# Patient Record
Sex: Female | Born: 1960 | Race: White | Hispanic: No | Marital: Married | Smoking: Never smoker
Health system: Southern US, Community
[De-identification: ages and names within clinical notes are randomized; demographics above are authoritative.]

## PROBLEM LIST (undated history)

## (undated) HISTORY — PX: BREAST ENHANCEMENT SURGERY: SHX7

## (undated) HISTORY — PX: HERNIA REPAIR: SHX51

---

## 2017-06-03 ENCOUNTER — Emergency Department (HOSPITAL_BASED_OUTPATIENT_CLINIC_OR_DEPARTMENT_OTHER): Payer: BC Managed Care – PPO

## 2017-06-03 ENCOUNTER — Encounter (HOSPITAL_BASED_OUTPATIENT_CLINIC_OR_DEPARTMENT_OTHER): Payer: Self-pay | Admitting: Emergency Medicine

## 2017-06-03 ENCOUNTER — Other Ambulatory Visit: Payer: Self-pay

## 2017-06-03 ENCOUNTER — Emergency Department (HOSPITAL_BASED_OUTPATIENT_CLINIC_OR_DEPARTMENT_OTHER)
Admission: EM | Admit: 2017-06-03 | Discharge: 2017-06-03 | Disposition: A | Discharge status: Home / Self Care | Payer: BC Managed Care – PPO | Attending: Emergency Medicine | Admitting: Emergency Medicine

## 2017-06-03 DIAGNOSIS — R0602 Shortness of breath: Secondary | ICD-10-CM | POA: Diagnosis present

## 2017-06-03 DIAGNOSIS — J209 Acute bronchitis, unspecified: Secondary | ICD-10-CM | POA: Insufficient documentation

## 2017-06-03 LAB — CBC WITH DIFFERENTIAL/PLATELET
BASOS ABS: 0 10*3/uL (ref 0.0–0.1)
BASOS PCT: 1 %
EOS ABS: 0.1 10*3/uL (ref 0.0–0.7)
Eosinophils Relative: 2 %
HEMATOCRIT: 37.8 % (ref 36.0–46.0)
Hemoglobin: 12.5 g/dL (ref 12.0–15.0)
Lymphocytes Relative: 12 %
Lymphs Abs: 0.5 10*3/uL — ABNORMAL LOW (ref 0.7–4.0)
MCH: 30.6 pg (ref 26.0–34.0)
MCHC: 33.1 g/dL (ref 30.0–36.0)
MCV: 92.4 fL (ref 78.0–100.0)
MONO ABS: 0.6 10*3/uL (ref 0.1–1.0)
MONOS PCT: 16 %
NEUTROS ABS: 2.7 10*3/uL (ref 1.7–7.7)
NEUTROS PCT: 69 %
Platelets: 299 10*3/uL (ref 150–400)
RBC: 4.09 MIL/uL (ref 3.87–5.11)
RDW: 12.2 % (ref 11.5–15.5)
WBC: 3.8 10*3/uL — ABNORMAL LOW (ref 4.0–10.5)

## 2017-06-03 LAB — BASIC METABOLIC PANEL
ANION GAP: 10 (ref 5–15)
BUN: 10 mg/dL (ref 6–20)
CALCIUM: 8.6 mg/dL — AB (ref 8.9–10.3)
CO2: 26 mmol/L (ref 22–32)
CREATININE: 0.79 mg/dL (ref 0.44–1.00)
Chloride: 104 mmol/L (ref 101–111)
GFR calc non Af Amer: >60 mL/min (ref >60)
Glucose, Bld: 102 mg/dL — ABNORMAL HIGH (ref 65–99)
Potassium: 3.7 mmol/L (ref 3.5–5.1)
SODIUM: 140 mmol/L (ref 135–145)

## 2017-06-03 LAB — TROPONIN I: Troponin I: <0.03 ng/mL (ref <0.03)

## 2017-06-03 LAB — D-DIMER, QUANTITATIVE (NOT AT ARMC): D DIMER QUANT: 0.72 ug{FEU}/mL — AB (ref 0.00–0.50)

## 2017-06-03 MED ORDER — IPRATROPIUM-ALBUTEROL 0.5-2.5 (3) MG/3ML IN SOLN
3.0000 mL | Freq: Once | RESPIRATORY_TRACT | Status: AC
  Administered 2017-06-03: 3 mL via RESPIRATORY_TRACT
  Filled 2017-06-03: qty 3

## 2017-06-03 MED ORDER — HYDROCOD POLST-CPM POLST ER 10-8 MG/5ML PO SUER: 5.0000 mL | Freq: Two times a day (BID) | ORAL | 0 refills | Status: DC | PRN

## 2017-06-03 MED ORDER — ALBUTEROL SULFATE HFA 108 (90 BASE) MCG/ACT IN AERS
2.0000 | INHALATION_SPRAY | RESPIRATORY_TRACT | Status: DC | PRN
  Administered 2017-06-03: 2 via RESPIRATORY_TRACT
  Filled 2017-06-03: qty 6.7

## 2017-06-03 MED ORDER — PREDNISONE 20 MG PO TABS: ORAL_TABLET | ORAL | 0 refills | Status: DC

## 2017-06-03 MED ORDER — IOPAMIDOL (ISOVUE-370) INJECTION 76%
100.0000 mL | Freq: Once | INTRAVENOUS | Status: AC | PRN
  Administered 2017-06-03: 100 mL via INTRAVENOUS

## 2017-06-03 MED ORDER — METHYLPREDNISOLONE SODIUM SUCC 125 MG IJ SOLR
125.0000 mg | Freq: Once | INTRAMUSCULAR | Status: AC
  Administered 2017-06-03: 125 mg via INTRAVENOUS
  Filled 2017-06-03: qty 2

## 2017-06-03 NOTE — ED Notes (Signed)
ED Provider at bedside. 

## 2017-06-03 NOTE — ED Notes (Signed)
Patient transported to X-ray 

## 2017-06-03 NOTE — ED Triage Notes (Addendum)
Pt with cough, cold and congestion. Pt feels shob. Pt's husband here x 2 days ago with same symptoms.

## 2017-06-03 NOTE — ED Provider Notes (Signed)
[facility] EMERGENCY DEPARTMENT Provider Note   CSN: [id number] Arrival date & time: 06/03/17  0354     History   Chief Complaint Chief Complaint  Patient presents with  . URI    HPI Jhane Bal is a 57 y.o. female.  Patient presents to the ER for evaluation of shortness of breath.  Patient reports that she has had a cold for several days.  She has had cough and congestion.  She denies nausea, vomiting, diarrhea.  She has not had myalgias or body aches.  Tonight her breathing worsens.  She felt like she could not catch her breath and then she started to notice pain with breathing.  Symptoms worsened when she lie down.  Patient reports that in the past she had to see a pulmonologist forward breathing difficulty, was treated with Flovent and albuterol.      History reviewed. No pertinent past medical history.  There are no active problems to display for this patient.   Past Surgical History:  Procedure Laterality Date  . BREAST ENHANCEMENT SURGERY    . CESAREAN SECTION    . HERNIA REPAIR      OB History    No data available       Home Medications    Prior to Admission medications   Medication Sig Start Date End Date Taking? Authorizing Provider  chlorpheniramine-HYDROcodone (TUSSIONEX PENNKINETIC ER) 10-8 MG/5ML SUER Take 5 mLs by mouth every 12 (twelve) hours as needed for cough. 06/03/17   Gilda Crease, MD  predniSONE (DELTASONE) 20 MG tablet 3 tabs po daily x 3 days, then 2 tabs x 3 days, then 1.5 tabs x 3 days, then 1 tab x 3 days, then 0.5 tabs x 3 days 06/03/17   Gilda Crease, MD    Family History No family history on file.  Social History Social History   Tobacco Use  . Smoking status: Never Smoker  . Smokeless tobacco: Never Used  Substance Use Topics  . Alcohol use: No    Frequency: Never  . Drug use: No     Allergies   Patient has no known allergies.   Review of Systems Review of Systems  Respiratory:  Positive for cough and shortness of breath.   All other systems reviewed and are negative.    Physical Exam Updated Vital Signs BP (!) 147/98 (BP Location: Left Arm)   Pulse (!) 112   Temp 100.1 F (37.8 C) (Oral)   Resp 20   Ht 5\' 5"  (1.651 m)   Wt 63.5 kg (140 lb)   SpO2 100%   BMI 23.30 kg/m   Physical Exam  Constitutional: She is oriented to person, place, and time. She appears well-developed and well-nourished. No distress.  HENT:  Head: Normocephalic and atraumatic.  Right Ear: Hearing normal.  Left Ear: Hearing normal.  Nose: Nose normal.  Mouth/Throat: Oropharynx is clear and moist and mucous membranes are normal.  Eyes: Conjunctivae and EOM are normal. Pupils are equal, round, and reactive to light.  Neck: Normal range of motion. Neck supple.  Cardiovascular: Regular rhythm, S1 normal and S2 normal. Exam reveals no gallop and no friction rub.  No murmur heard. Pulmonary/Chest: Effort normal and breath sounds normal. No respiratory distress. She exhibits no tenderness.  Abdominal: Soft. Normal appearance and bowel sounds are normal. There is no hepatosplenomegaly. There is no tenderness. There is no rebound, no guarding, no tenderness at McBurney's point and negative Murphy's sign. No hernia.  Musculoskeletal: Normal  range of motion.  Neurological: She is alert and oriented to person, place, and time. She has normal strength. No cranial nerve deficit or sensory deficit. Coordination normal. GCS eye subscore is 4. GCS verbal subscore is 5. GCS motor subscore is 6.  Skin: Skin is warm, dry and intact. No rash noted. No cyanosis.  Psychiatric: Her speech is normal and behavior is normal. Thought content normal. Her mood appears anxious.  Nursing note and vitals reviewed.    ED Treatments / Results  Labs (all labs ordered are listed, but only abnormal results are displayed) Labs Reviewed  CBC WITH DIFFERENTIAL/PLATELET - Abnormal; Notable for the following components:        Result Value   WBC 3.8 (*)    Lymphs Abs 0.5 (*)    All other components within normal limits  BASIC METABOLIC PANEL - Abnormal; Notable for the following components:   Glucose, Bld 102 (*)    Calcium 8.6 (*)    All other components within normal limits  D-DIMER, QUANTITATIVE (NOT AT [facility]) - Abnormal; Notable for the following components:   D-Dimer, Quant 0.72 (*)    All other components within normal limits  TROPONIN I    EKG  EKG Interpretation None     ED ECG REPORT   Date: 06/03/2017  Rate: 117  Rhythm: sinus tachycardia  QRS Axis: normal  Intervals: normal  ST/T Wave abnormalities: normal  Conduction Disutrbances:none  Narrative Interpretation:   Old EKG Reviewed: none available  I have personally reviewed the EKG tracing and agree with the computerized printout as noted.   Radiology Dg Chest 2 View  Result Date: 06/03/2017 CLINICAL DATA:  Cough, cold, congestion and shortness of breath for 2 weeks. EXAM: CHEST  2 VIEW COMPARISON:  None. FINDINGS: Cardiomediastinal silhouette is normal. No pleural effusions or focal consolidations. Trachea projects midline and there is no pneumothorax. Soft tissue planes and included osseous structures are non-suspicious. Bilateral breast implants. IMPRESSION: Negative. Electronically Signed   By: Awilda Metro M.D.   On: 06/03/2017 04:47   Ct Angio Chest Pe W Or Wo Contrast  Result Date: 06/03/2017 CLINICAL DATA:  Shortness of breath. Cough, cold, congestion. Elevated D-dimer. EXAM: CT ANGIOGRAPHY CHEST WITH CONTRAST TECHNIQUE: Multidetector CT imaging of the chest was performed using the standard protocol during bolus administration of intravenous contrast. Multiplanar CT image reconstructions and MIPs were obtained to evaluate the vascular anatomy. CONTRAST:  ISOVUE-370 IOPAMIDOL (ISOVUE-370) INJECTION 76% COMPARISON:  Chest radiographs earlier this day. FINDINGS: Cardiovascular: There are no filling defects within the  pulmonary arteries to suggest pulmonary embolus. Thoracic aorta is normal caliber without dissection or aneurysm. The heart is normal in size. No pericardial effusion. Mediastinum/Nodes: No enlarged mediastinal or hilar nodes. The esophagus is decompressed. No thyroid nodule. Lungs/Pleura: Clear lungs. No consolidation, pulmonary edema or pleural fluid. No pulmonary mass or suspicious nodule. Upper Abdomen: Normal.  No acute finding. Musculoskeletal: There are no acute or suspicious osseous abnormalities. Bilateral breast implants. Review of the MIP images confirms the above findings. IMPRESSION: Normal CTA of the chest.  No pulmonary embolus. Electronically Signed   By: Rubye Oaks M.D.   On: 06/03/2017 05:40    Procedures Procedures (including critical care time)  Medications Ordered in ED Medications  albuterol (PROVENTIL HFA;VENTOLIN HFA) 108 (90 Base) MCG/ACT inhaler 2 puff (not administered)  methylPREDNISolone sodium succinate (SOLU-MEDROL) 125 mg/2 mL injection 125 mg (125 mg Intravenous Given 06/03/17 0430)  ipratropium-albuterol (DUONEB) 0.5-2.5 (3) MG/3ML nebulizer solution 3  mL (3 mLs Nebulization Given 06/03/17 0421)  iopamidol (ISOVUE-370) 76 % injection 100 mL (100 mLs Intravenous Contrast Given 06/03/17 0513)     Initial Impression / Assessment and Plan / ED Course  I have reviewed the triage vital signs and the nursing notes.  Pertinent labs & imaging results that were available during my care of the patient were reviewed by me and considered in my medical decision making (see chart for details).     Patient presents with complaints of chest pain, shortness of breath.  This occurs in the setting of cold symptoms including cough.  Patient is extremely anxious at arrival.  I suspect that majority of her symptoms are secondary to anxiety and panic attack.  She does have some cold symptoms and did have a borderline low-grade fever at arrival.  Patient started to complain of chest  pain and then pleuritic pain here in the ER.  She therefore underwent thorough workup to rule out other etiologies.  Patient's d-dimer was mildly elevated.  She was tachycardic and having some pleuritic pain, could not rule out PE.  Patient therefore underwent CT angiography.  This was clear, no PE no pneumonia.  Final Clinical Impressions(s) / ED Diagnoses   Final diagnoses:  Acute bronchitis, unspecified organism    ED Discharge Orders        Ordered    predniSONE (DELTASONE) 20 MG tablet     06/03/17 0606    chlorpheniramine-HYDROcodone (TUSSIONEX PENNKINETIC ER) 10-8 MG/5ML SUER  Every 12 hours PRN     06/03/17 0606       Gilda CreasePollina, Christopher J, MD 06/03/17 [phone]

## 2019-01-17 IMAGING — CT CT ANGIO CHEST
2 of 7 series · 19 of 36 positions shown · IV contrast (iopamidol)
Comparison: Chest radiographs earlier this day.

CLINICAL DATA: Shortness of breath. Cough, cold, congestion.
Elevated D-dimer.

EXAM:
CT ANGIOGRAPHY CHEST WITH CONTRAST
TECHNIQUE: Multidetector CT imaging of the chest was performed using the
standard protocol during bolus administration of intravenous
contrast. Multiplanar CT image reconstructions and MIPs were
obtained to evaluate the vascular anatomy.
CONTRAST:  100mL 5PXV6Y-QEC IOPAMIDOL (5PXV6Y-QEC) INJECTION 76%

[Series 10: pe coronal mpr · coronal · 0.59mm/px · 1 of 114 slices shown]
[im 57/114  mediastinal]
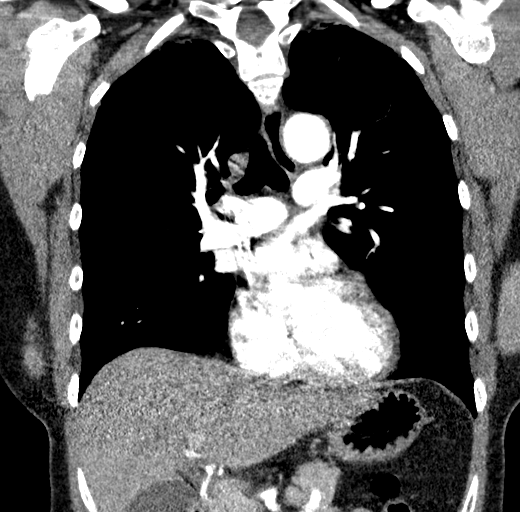

[Series 14: pe thins · axial · 0.64mm/px · z∈[+1020,+1296]mm · 18 of 309 slices shown]
[im 16/309  lung]
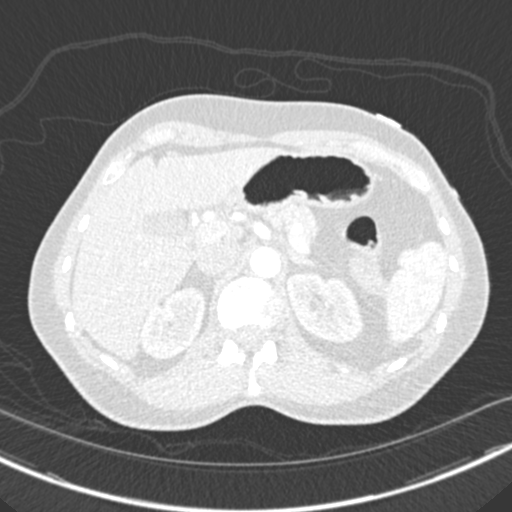
[im 31/309  mediastinal]
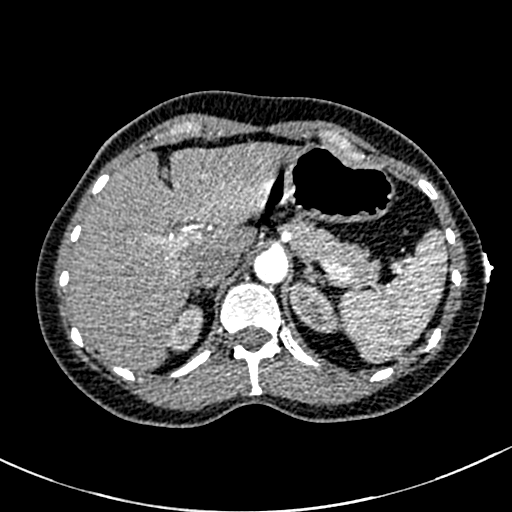
[im 47/309  lung]
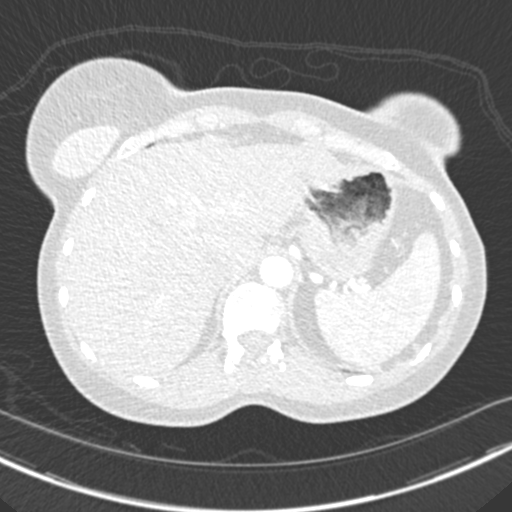
[im 62/309  mediastinal]
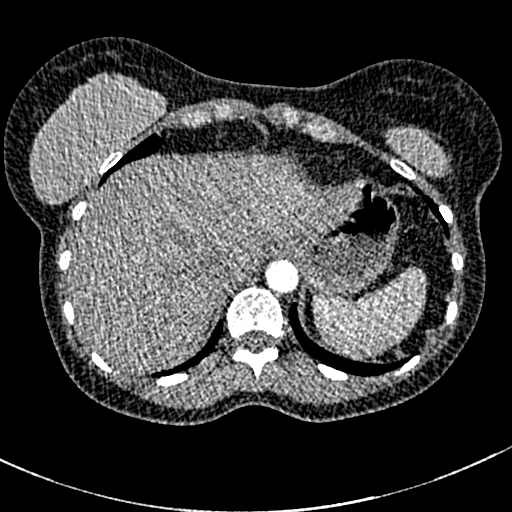
[im 78/309  lung]
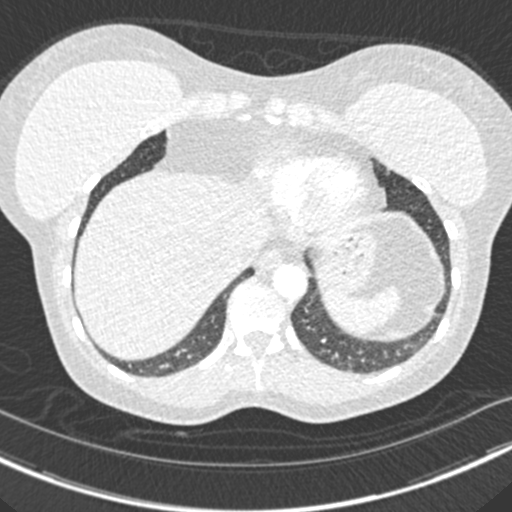
[im 93/309  mediastinal]
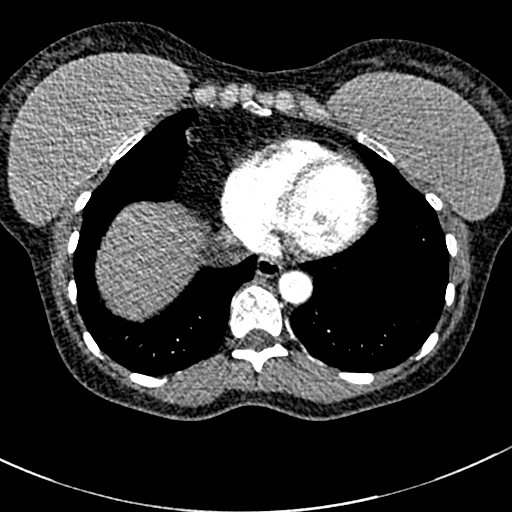
[im 108/309  lung]
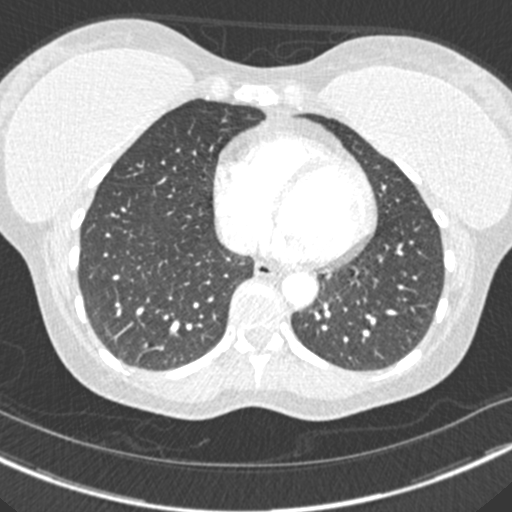
[im 124/309  mediastinal]
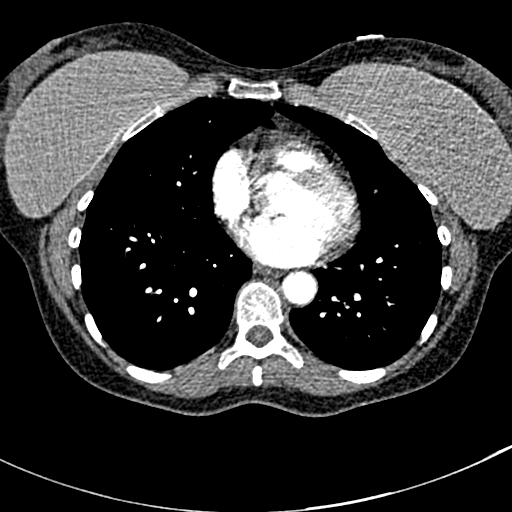
[im 139/309  lung]
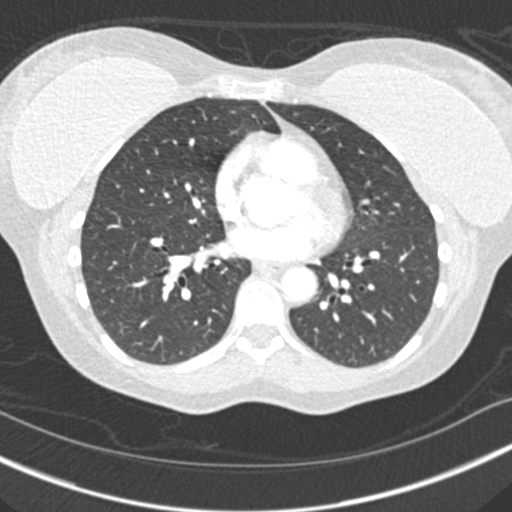
[im 170/309  mediastinal]
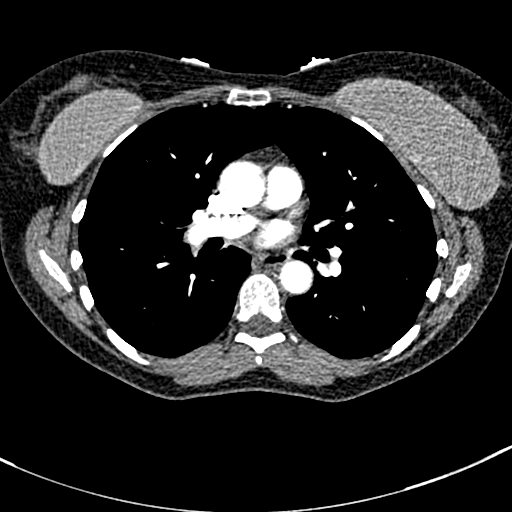
[im 185/309  lung]
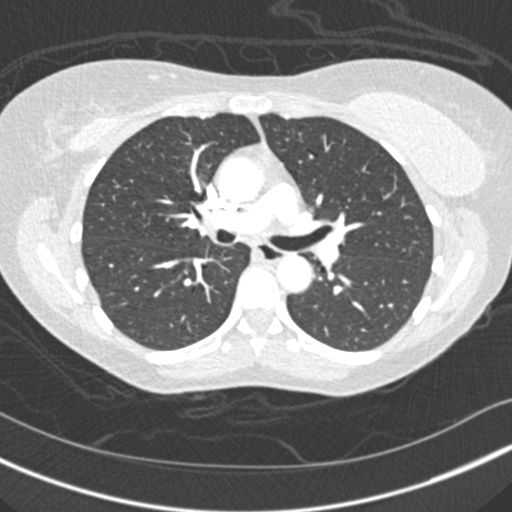
[im 201/309  mediastinal]
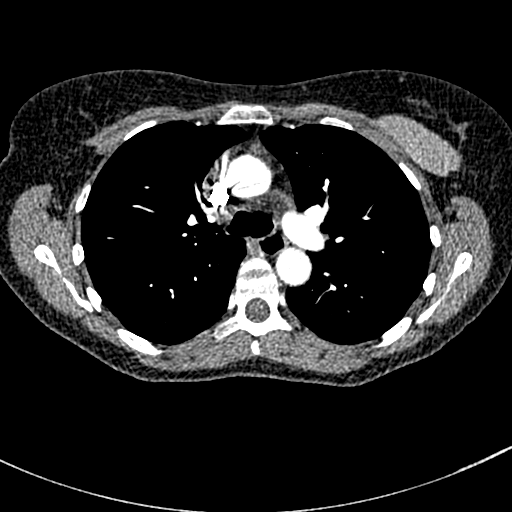
[im 216/309  lung]
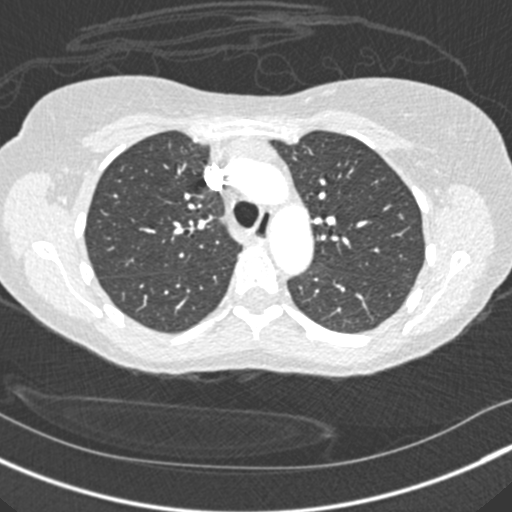
[im 232/309  mediastinal]
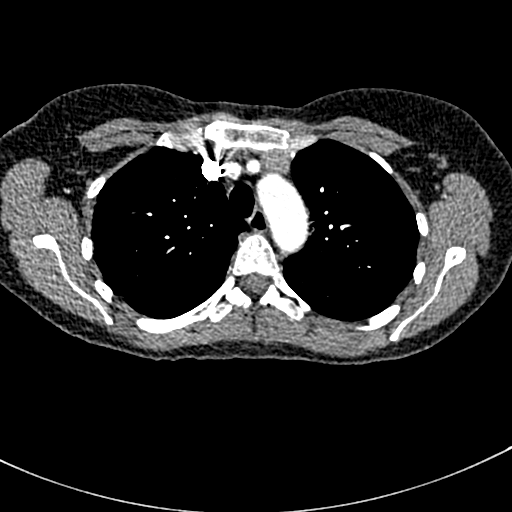
[im 247/309  lung]
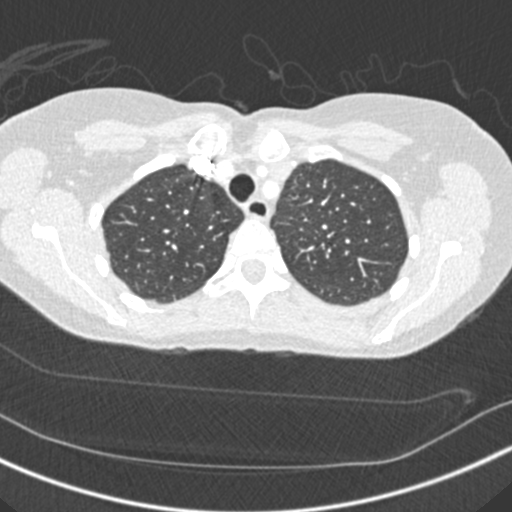
[im 262/309  mediastinal]
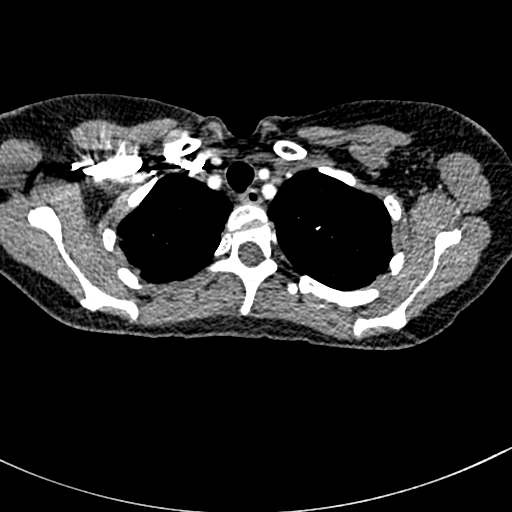
[im 278/309  lung]
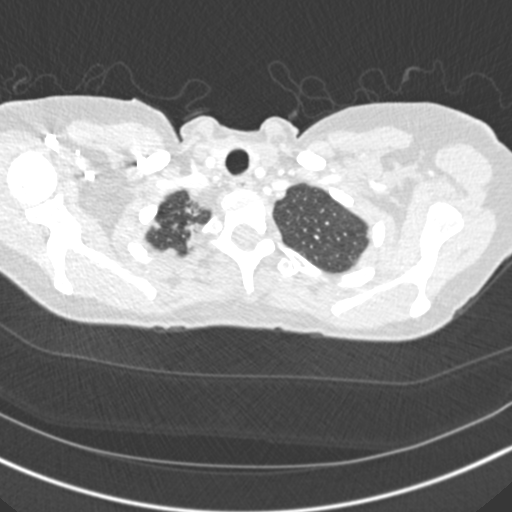
[im 293/309  mediastinal]
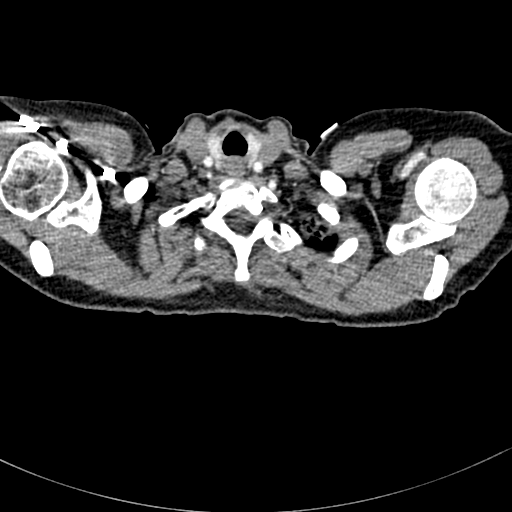

[19 of 36 positions shown; findings below may reference images not displayed]

FINDINGS: Cardiovascular: There are no filling defects within the pulmonary
arteries to suggest pulmonary embolus. Thoracic aorta is normal
caliber without dissection or aneurysm. The heart is normal in size.
No pericardial effusion.

Mediastinum/Nodes: No enlarged mediastinal or hilar nodes. The
esophagus is decompressed. No thyroid nodule.

Lungs/Pleura: Clear lungs. No consolidation, pulmonary edema or
pleural fluid. No pulmonary mass or suspicious nodule.

Upper Abdomen: Normal.  No acute finding.

Musculoskeletal: There are no acute or suspicious osseous
abnormalities. Bilateral breast implants.

Review of the MIP images confirms the above findings.
IMPRESSION: Normal CTA of the chest.  No pulmonary embolus.

## 2022-10-10 ENCOUNTER — Ambulatory Visit
Admission: EM | Admit: 2022-10-10 | Discharge: 2022-10-10 | Disposition: A | Discharge status: Home / Self Care | Payer: BC Managed Care – PPO

## 2022-10-10 ENCOUNTER — Ambulatory Visit (INDEPENDENT_AMBULATORY_CARE_PROVIDER_SITE_OTHER): Payer: BC Managed Care – PPO

## 2022-10-10 DIAGNOSIS — M79672 Pain in left foot: Secondary | ICD-10-CM | POA: Diagnosis not present

## 2022-10-10 NOTE — Discharge Instructions (Addendum)
Advised patient of left foot x-ray results with hardcopy provided.  Advised if symptoms worsen and/or unresolved please follow-up with your PCP or [facility] podiatry on Monday, 10/13/2022 for further evaluation of neuroma type pain.  Contact information has been provided with this AVS today.

## 2022-10-10 NOTE — ED Provider Notes (Signed)
[facility]    CSN: [id number] Arrival date & time: 10/10/22  1506      History   Chief Complaint Chief Complaint  Patient presents with   Foot Pain    LT    HPI Melanie Harrison is a 62 y.o. female.   HPI 62 year old female presents with left foot pain for 2.5 weeks denies injury or insult.  Reports symptoms have worsened over the past 2 days.  Patient reports feels like cyst on the sole of her left foot.  PMH significant for breast enhancement surgery and recurrent urinary tract infections.  History reviewed. No pertinent past medical history.  There are no problems to display for this patient.   Past Surgical History:  Procedure Laterality Date   BREAST ENHANCEMENT SURGERY     CESAREAN SECTION     HERNIA REPAIR      OB History   No obstetric history on file.      Home Medications    Prior to Admission medications   Medication Sig Start Date End Date Taking? Authorizing Provider  meclizine (ANTIVERT) 25 MG tablet     [provider]    Family History History reviewed. No pertinent family history.  Social History Social History   Tobacco Use   Smoking status: Never   Smokeless tobacco: Never  Substance Use Topics   Alcohol use: No   Drug use: No     Allergies   Patient has no known allergies.   Review of Systems Review of Systems  Musculoskeletal:        Left foot pain  All other systems reviewed and are negative.    Physical Exam Triage Vital Signs ED Triage Vitals  Enc Vitals Group     BP      Pulse      Resp      Temp      Temp src      SpO2      Weight      Height      Head Circumference      Peak Flow      Pain Score      Pain Loc      Pain Edu?      Excl. in GC?    No data found.  Updated Vital Signs BP 125/78 (BP Location: Right Arm)   Pulse 78   Temp 98.2 F (36.8 C) (Oral)   Resp 17   SpO2 98%    Physical Exam Vitals and nursing note reviewed.  Constitutional:      Appearance:  Normal appearance. She is normal weight.  HENT:     Head: Normocephalic and atraumatic.     Mouth/Throat:     Mouth: Mucous membranes are moist.     Pharynx: Oropharynx is clear.  Eyes:     Extraocular Movements: Extraocular movements intact.     Conjunctiva/sclera: Conjunctivae normal.     Pupils: Pupils are equal, round, and reactive to light.  Cardiovascular:     Rate and Rhythm: Normal rate and regular rhythm.     Pulses: Normal pulses.     Heart sounds: Normal heart sounds.  Pulmonary:     Effort: Pulmonary effort is normal.     Breath sounds: Normal breath sounds. No wheezing, rhonchi or rales.  Genitourinary:    General: Normal vulva.  Musculoskeletal:        General: Normal range of motion.     Cervical back: Normal range of motion and  neck supple.     Comments: Left foot (sole adjacent to calcaneus, lateral aspect) small (<1.0 cm) painful nodule noted  Skin:    General: Skin is warm and dry.  Neurological:     General: No focal deficit present.     Mental Status: She is alert and oriented to person, place, and time. Mental status is at baseline.  Psychiatric:        Mood and Affect: Mood normal.        Behavior: Behavior normal.      UC Treatments / Results  Labs (all labs ordered are listed, but only abnormal results are displayed) Labs Reviewed - No data to display  EKG   Radiology DG Foot Complete Left  Result Date: 10/10/2022 CLINICAL DATA:  Pain on sole of left foot EXAM: LEFT FOOT - COMPLETE 3+ VIEW COMPARISON:  None Available. FINDINGS: No acute fracture or dislocation.The joint spaces are preserved.Alignment is unremarkable.No radiopaque soft tissue abnormality or foreign body. IMPRESSION: No acute osseous abnormality. Electronically Signed   By: Wiliam Ke M.D.   On: 10/10/2022 16:13    Procedures Procedures (including critical care time)  Medications Ordered in UC Medications - No data to display  Initial Impression / Assessment and Plan /  UC Course  I have reviewed the triage vital signs and the nursing notes.  Pertinent labs & imaging results that were available during my care of the patient were reviewed by me and considered in my medical decision making (see chart for details).     MDM; 1.  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  Advised patient to follow-up with [facility] podiatry as soon as possible for further evaluation contact information provided with this AVS today. Advised patient of left foot x-ray results with hardcopy provided.  Advised if symptoms worsen and/or unresolved please follow-up with your PCP or [facility] podiatry on Monday, 10/13/2022 for further evaluation of neuroma type pain.  Contact information has been provided with this AVS today.  Patient discharged home, hemodynamically stable. Final Clinical Impressions(s) / UC Diagnoses   Final diagnoses:  Foot pain, left     Discharge Instructions      Advised patient of left foot x-ray results with hardcopy provided.  Advised if symptoms worsen and/or unresolved please follow-up with your PCP or [facility] podiatry on Monday, 10/13/2022 for further evaluation of neuroma type pain.  Contact information has been provided with this AVS today.     ED Prescriptions   None    PDMP not reviewed this encounter.   Trevor Iha, FNP 10/10/22 1640

## 2022-10-10 NOTE — ED Triage Notes (Signed)
Pt c/o LT foot pain x 2.5 weeks. Denies injury. Sxs worsening in last couple days. States it almost feels like a cyst in the bottom of her foot. Warm foot soaks and tylenol prn.

## 2022-10-30 ENCOUNTER — Ambulatory Visit (INDEPENDENT_AMBULATORY_CARE_PROVIDER_SITE_OTHER): Payer: Self-pay | Admitting: Podiatry

## 2022-10-30 DIAGNOSIS — Z91199 Patient's noncompliance with other medical treatment and regimen due to unspecified reason: Secondary | ICD-10-CM

## 2022-10-30 NOTE — Progress Notes (Signed)
No show

## 2023-06-16 ENCOUNTER — Ambulatory Visit: Admission: EM | Admit: 2023-06-16 | Discharge: 2023-06-16 | Disposition: A | Discharge status: Home / Self Care

## 2023-06-16 DIAGNOSIS — J029 Acute pharyngitis, unspecified: Secondary | ICD-10-CM

## 2023-06-16 LAB — POCT INFLUENZA A/B
Influenza A, POC: NEGATIVE
Influenza B, POC: NEGATIVE

## 2023-06-16 LAB — POC SARS CORONAVIRUS 2 AG -  ED: SARS Coronavirus 2 Ag: NEGATIVE

## 2023-06-16 MED ORDER — AZITHROMYCIN 250 MG PO TABS: 250.0000 mg | ORAL_TABLET | Freq: Every day | ORAL | 0 refills | Status: AC

## 2023-06-16 NOTE — Discharge Instructions (Addendum)
 Advised patient to take medication as directed with food to completion.  Encouraged to increase daily water intake to 64 ounces per day while taking this medication.  Advised if symptoms worsen and/or unresolved please follow-up with your PCP or here for further evaluation.

## 2023-06-16 NOTE — ED Provider Notes (Signed)
 [facility]    CSN: [id number] Arrival date & time: 06/16/23  1258      History   Chief Complaint Chief Complaint  Patient presents with   Sore Throat   Cough    HPI Melanie Harrison is a 63 y.o. female.   HPI 63 year old female presents with cough and sore throat that began yesterday.  PMH significant for s/p hernia repair and HLD.  History reviewed. No pertinent past medical history.  There are no active problems to display for this patient.   Past Surgical History:  Procedure Laterality Date   BREAST ENHANCEMENT SURGERY     CESAREAN SECTION     HERNIA REPAIR      OB History   No obstetric history on file.      Home Medications    Prior to Admission medications   Medication Sig Start Date End Date Taking? Authorizing Provider  azithromycin (ZITHROMAX) 250 MG tablet Take 1 tablet (250 mg total) by mouth daily. Take first 2 tablets together, then 1 every day until finished. 06/16/23  Yes Trevor Iha, FNP  rosuvastatin (CRESTOR) 10 MG tablet Take 10 mg by mouth daily. 03/04/23  Yes [provider]  meclizine (ANTIVERT) 25 MG tablet     [provider]    Family History History reviewed. No pertinent family history.  Social History Social History   Tobacco Use   Smoking status: Never   Smokeless tobacco: Never  Substance Use Topics   Alcohol use: No   Drug use: No     Allergies   Patient has no known allergies.   Review of Systems Review of Systems   Physical Exam Triage Vital Signs ED Triage Vitals  Encounter Vitals Group     BP 06/16/23 1310 (!) 145/86     Systolic BP Percentile --      Diastolic BP Percentile --      Pulse Rate 06/16/23 1310 95     Resp 06/16/23 1310 17     Temp 06/16/23 1310 98.6 F (37 C)     Temp Source 06/16/23 1310 Oral     SpO2 06/16/23 1310 95 %     Weight --      Height --      Head Circumference --      Peak Flow --      Pain Score 06/16/23 1311 6     Pain Loc --      Pain  Education --      Exclude from Growth Chart --    No data found.  Updated Vital Signs BP (!) 145/86 (BP Location: Right Arm)   Pulse 95   Temp 98.6 F (37 C) (Oral)   Resp 17   SpO2 95%   Visual Acuity Right Eye Distance:   Left Eye Distance:   Bilateral Distance:    Right Eye Near:   Left Eye Near:    Bilateral Near:     Physical Exam Vitals and nursing note reviewed.  Constitutional:      Appearance: Normal appearance. She is normal weight.  HENT:     Head: Normocephalic and atraumatic.     Right Ear: Tympanic membrane, ear canal and external ear normal.     Left Ear: Tympanic membrane, ear canal and external ear normal.     Mouth/Throat:     Mouth: Mucous membranes are moist.     Pharynx: Oropharynx is clear. Uvula midline. Posterior oropharyngeal erythema present.  Eyes:  Extraocular Movements: Extraocular movements intact.     Conjunctiva/sclera: Conjunctivae normal.     Pupils: Pupils are equal, round, and reactive to light.  Cardiovascular:     Rate and Rhythm: Normal rate and regular rhythm.     Pulses: Normal pulses.     Heart sounds: Normal heart sounds.  Pulmonary:     Effort: Pulmonary effort is normal.     Breath sounds: Normal breath sounds. No wheezing, rhonchi or rales.  Musculoskeletal:        General: Normal range of motion.     Cervical back: Normal range of motion and neck supple.  Skin:    General: Skin is warm and dry.  Neurological:     General: No focal deficit present.     Mental Status: She is alert and oriented to person, place, and time. Mental status is at baseline.  Psychiatric:        Mood and Affect: Mood normal.        Behavior: Behavior normal.        Thought Content: Thought content normal.      UC Treatments / Results  Labs (all labs ordered are listed, but only abnormal results are displayed) Labs Reviewed  POC SARS CORONAVIRUS 2 AG -  ED  POCT INFLUENZA A/B    EKG   Radiology No results  found.  Procedures Procedures (including critical care time)  Medications Ordered in UC Medications - No data to display  Initial Impression / Assessment and Plan / UC Course  I have reviewed the triage vital signs and the nursing notes.  Pertinent labs & imaging results that were available during my care of the patient were reviewed by me and considered in my medical decision making (see chart for details).     MDM: 1.  Acute pharyngitis, unspecified etiology-Rx'd Zithromax (500 mg day 1, 250 mg day 2-5). Advised patient to take medication as directed with food to completion.  Encouraged to increase daily water intake to 64 ounces per day while taking this medication.  Advised if symptoms worsen and/or unresolved please follow-up with your PCP or here for further evaluation.  Patient discharged home, hemodynamically stable. Final Clinical Impressions(s) / UC Diagnoses   Final diagnoses:  Acute pharyngitis, unspecified etiology     Discharge Instructions      Advised patient to take medication as directed with food to completion.  Encouraged to increase daily water intake to 64 ounces per day while taking this medication.  Advised if symptoms worsen and/or unresolved please follow-up with your PCP or here for further evaluation.     ED Prescriptions     Medication Sig Dispense Auth. Provider   azithromycin (ZITHROMAX) 250 MG tablet Take 1 tablet (250 mg total) by mouth daily. Take first 2 tablets together, then 1 every day until finished. 6 tablet Trevor Iha, FNP      PDMP not reviewed this encounter.   Trevor Iha, FNP 06/16/23 [phone]

## 2023-06-16 NOTE — ED Triage Notes (Signed)
 Pt c/o cough and sore throat since yesterday. No known fever. Taking tylenol cold and sinus prn.
